# Patient Record
Sex: Female | Born: 1960 | Race: White | Hispanic: No | Smoking: Current every day smoker
Health system: Southern US, Community
[De-identification: ages and names within clinical notes are randomized; demographics above are authoritative.]

## PROBLEM LIST (undated history)

## (undated) DIAGNOSIS — J449 Chronic obstructive pulmonary disease, unspecified: Secondary | ICD-10-CM

## (undated) HISTORY — DX: Chronic obstructive pulmonary disease, unspecified: J44.9

## (undated) HISTORY — PX: TUBAL LIGATION: SHX77

## (undated) HISTORY — PX: COSMETIC SURGERY: SHX468

## (undated) HISTORY — PX: CHOLECYSTECTOMY: SHX55

---

## 1999-04-02 ENCOUNTER — Other Ambulatory Visit: Admission: RE | Admit: 1999-04-02 | Discharge: 1999-04-02 | Payer: Self-pay | Admitting: Family Medicine

## 2005-03-27 ENCOUNTER — Other Ambulatory Visit: Payer: Self-pay

## 2005-03-27 ENCOUNTER — Inpatient Hospital Stay: Payer: Self-pay | Admitting: Infectious Diseases

## 2006-01-01 ENCOUNTER — Ambulatory Visit: Payer: Self-pay | Admitting: Internal Medicine

## 2006-02-04 ENCOUNTER — Ambulatory Visit: Payer: Self-pay | Admitting: Internal Medicine

## 2006-04-08 ENCOUNTER — Ambulatory Visit: Payer: Self-pay | Admitting: Internal Medicine

## 2006-10-05 ENCOUNTER — Ambulatory Visit: Payer: Self-pay | Admitting: Internal Medicine

## 2006-10-27 IMAGING — CR DG CHEST 2V
1 series · 2 of 2 positions shown · non-contrast
Comparison: none

REASON FOR EXAM: Shortness of breath
COMMENTS:

PROCEDURE:     DXR - DXR CHEST PA (OR AP) AND LATERAL  - March 27, 2005  [DATE]
RESULT:     The lungs are mildly hyperinflated.  Increased perihilar lung
markings are present.  There is blunting of the posterior costophrenic
angles.  The heart is top normal in size.

[Series 1: view not recorded · 0.17mm/px · 2 of 2 slices shown]
[im 1/2]
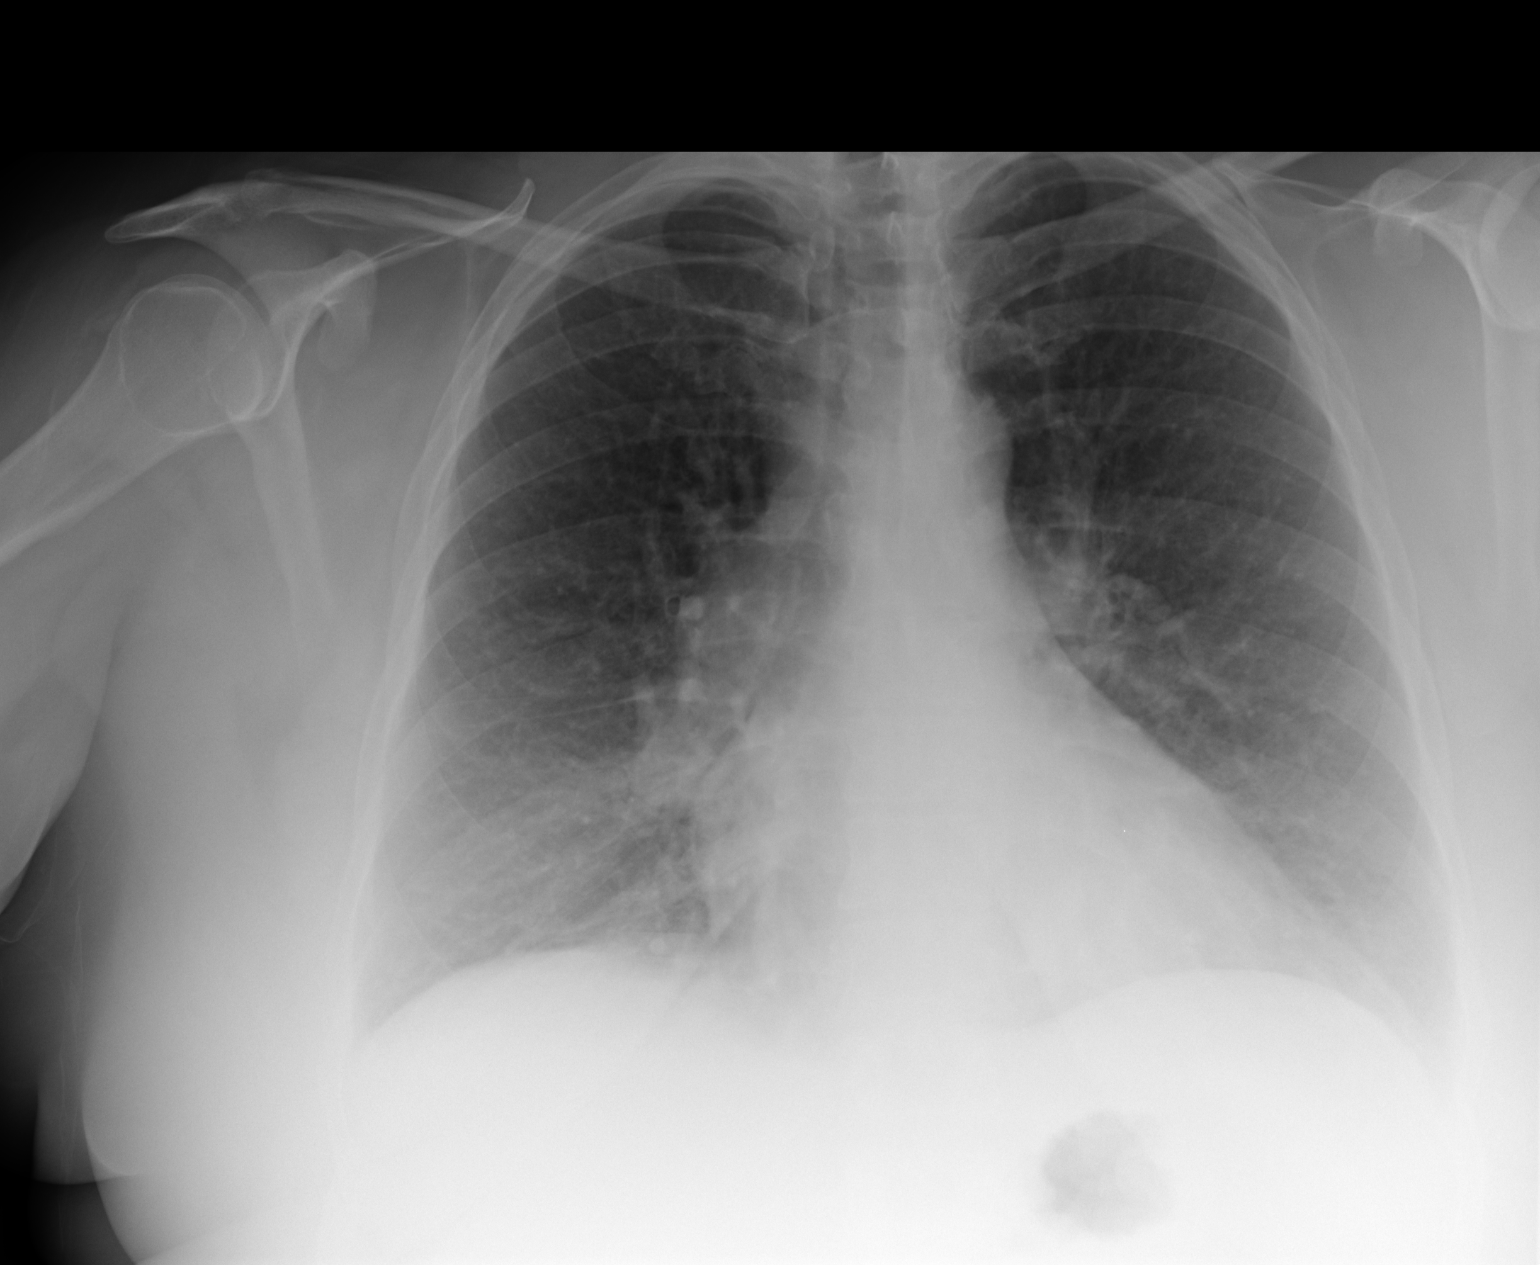
[im 2/2]
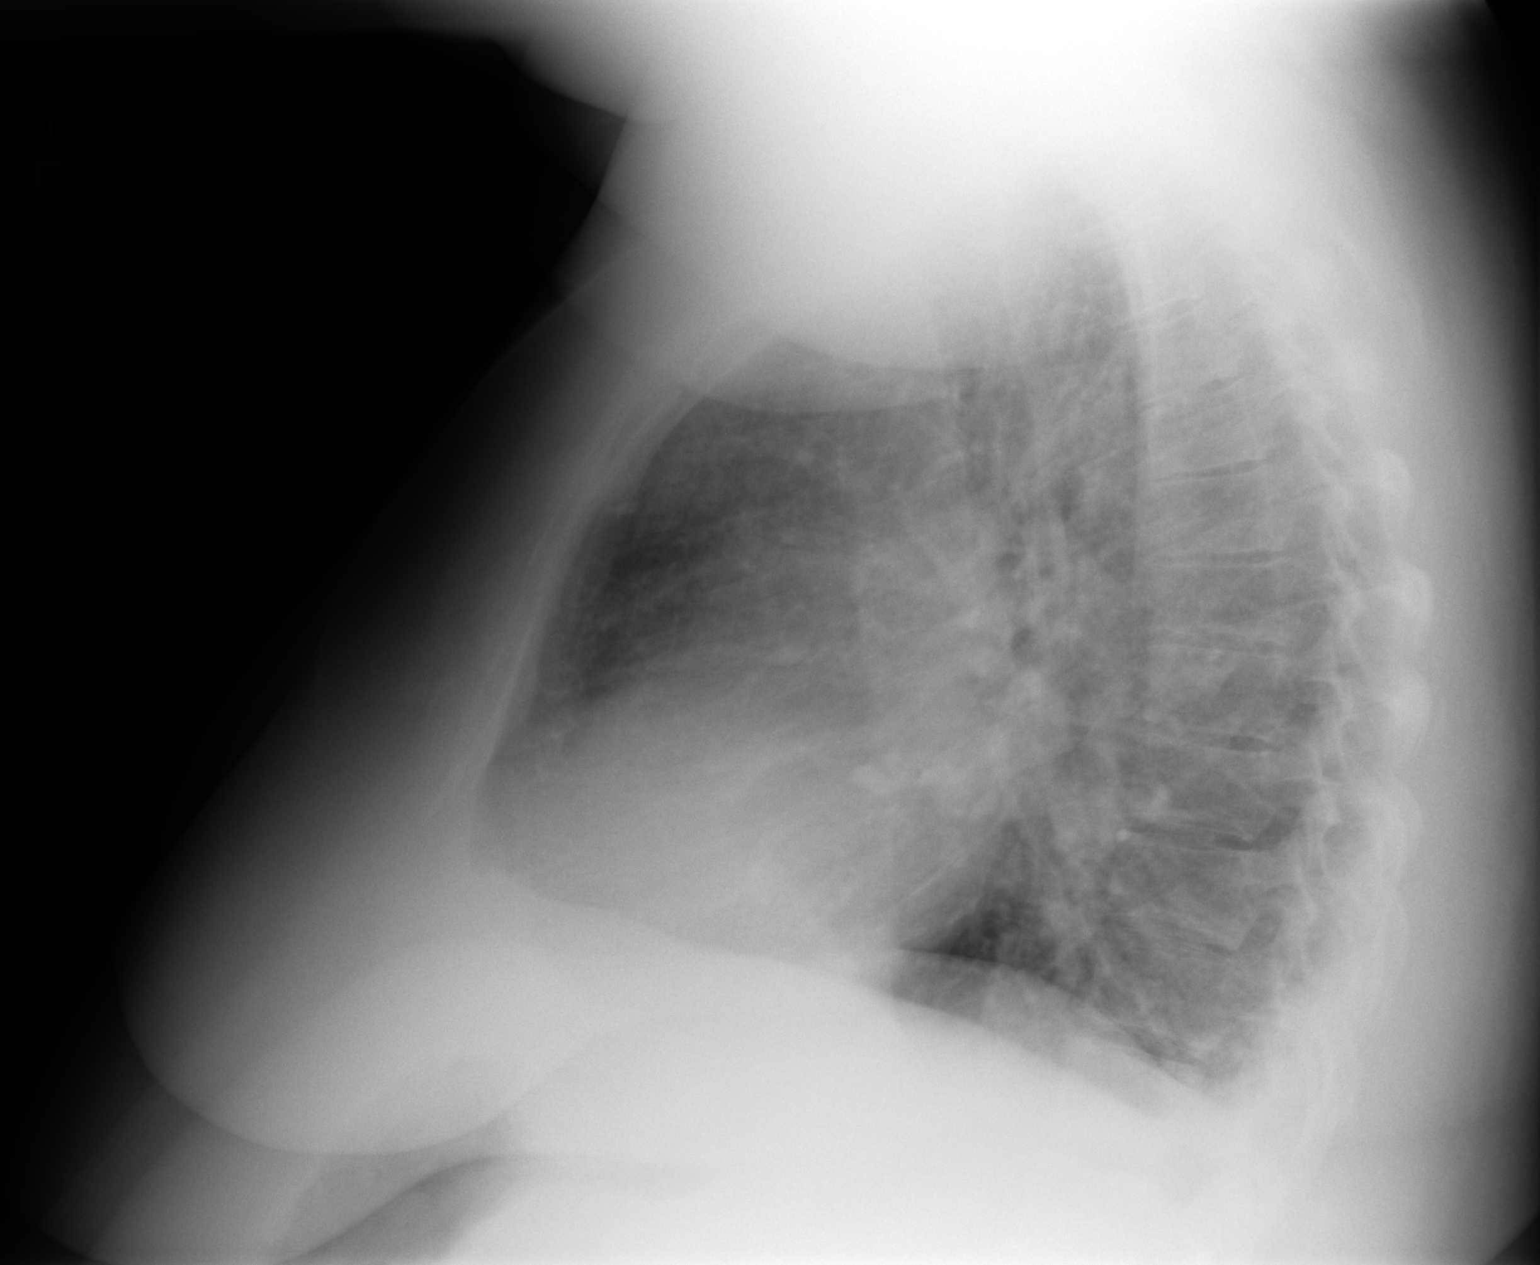

[2 of 2 positions shown; findings below may reference images not displayed]

IMPRESSION: There are findings which likely reflect acute
bronchitis.  I do not see evidence of pneumonia.  Follow films are
recommended to assure resolution of the patient's symptoms following
therapy.

## 2007-11-10 DIAGNOSIS — G4733 Obstructive sleep apnea (adult) (pediatric): Secondary | ICD-10-CM

## 2007-11-10 DIAGNOSIS — J45909 Unspecified asthma, uncomplicated: Secondary | ICD-10-CM | POA: Insufficient documentation

## 2007-11-10 DIAGNOSIS — R071 Chest pain on breathing: Secondary | ICD-10-CM

## 2007-11-10 DIAGNOSIS — I872 Venous insufficiency (chronic) (peripheral): Secondary | ICD-10-CM | POA: Insufficient documentation

## 2007-11-10 DIAGNOSIS — F17200 Nicotine dependence, unspecified, uncomplicated: Secondary | ICD-10-CM

## 2007-11-10 DIAGNOSIS — J4489 Other specified chronic obstructive pulmonary disease: Secondary | ICD-10-CM | POA: Insufficient documentation

## 2007-11-10 DIAGNOSIS — J449 Chronic obstructive pulmonary disease, unspecified: Secondary | ICD-10-CM

## 2007-11-10 DIAGNOSIS — K219 Gastro-esophageal reflux disease without esophagitis: Secondary | ICD-10-CM

## 2007-11-14 ENCOUNTER — Ambulatory Visit: Payer: Self-pay | Admitting: Internal Medicine

## 2010-12-19 NOTE — Assessment & Plan Note (Signed)
[facility]                               PULMONARY OFFICE NOTE   NAME:Kelly Dyer, Kelly Dyer                        MRN:          [id number]  DATE:04/08/2006                            DOB:          [birth year]    PROBLEM LIST:  1. Chronic obstructive pulmonary disease with asthma.  2. Probable esophageal reflux.  3. Probable obstructive sleep apnea.  4. Tobacco use.  5. Musculoskeletal chest wall pain.  6. Peripheral venous insufficiency with varices.   HISTORY OF PRESENT ILLNESS:  She continues to smoke and has not really made  any lifestyle changes since last year.  She says she is trying Chantix.  She  describes an occasional burning sensation across the mid scapular area only  when sitting quietly.  It is not exertional and does not seem to radiate or  associate with food.  Theophylline has been some help and has not caused  problems she recognizes at 200 mg b.i.d.   MEDICATIONS:  1. Wellbutrin.  2. Advair 500/50.  3. Blood pressure medicine she still does not know the name of.  4. Spiriva.  5. Singulair.  6. Home nebulizer with DuoNeb use two to three times a day when needed.  7. Home oxygen at 2 L used at night.  8. Theophylline 200 mg b.i.d.  9. Claritin.   ALLERGIES:  No known drug allergies.   PHYSICAL EXAMINATION:  VITAL SIGNS:  Weight is down a little at 307 pounds,  BP 140/76, pulse regular at 87, room air saturation at 94%.  NECK:  I find no adenopathy and no neck vein distention and no bruits.  LUNGS:  She has a mild cough, but no rhonchi or wheeze.  She remains quite  obese.  HEART:  Heart sounds are regular without murmur or gallop.  EXTREMITIES:  Legs are heavy with some peripheral varices.   Chest x-ray last time had shown severe obstructive airway disease.   IMPRESSION:  1. Asthma with chronic obstructive pulmonary disease.  2. Ongoing tobacco abuse.  3. Obesity.   I discussed mid scapular pain or discomfort as  nonspecific, but possibly  reflecting angina, reflux or arthritic changes in the back.  She is going to  pay more attention to pattern and especially to whether it is changing.  She  will share this with Dr. Purnell Shoemaker, but my impression at this point is that it  does not sound cardiac.   PLAN:  1. Smoking cessation and weight loss were re-emphasized.  2. Walk for endurance as well as tolerated.  3. Continue Theophylline at 200 mg b.i.d. with other medications as      before.  4. Schedule return in 6 months, earlier p.r.n.                                   Clinton D. Maple Hudson, MD, [facility], FACP   CDY/MedQ  DD:  04/08/2006  DT:  04/09/2006  Job #:  [id number]   cc:   Lianne Bushy, M.D.

## 2010-12-19 NOTE — Assessment & Plan Note (Signed)
[facility]                             PULMONARY OFFICE NOTE   NAME:Kerschner, MISHAL PROBERT                        MRN:          [id number]  DATE:10/05/2006                            DOB:          25-Jun-1961    PROBLEMS:  1. Chronic obstructive pulmonary disease with asthma.  2. Probable esophageal reflux.  3. Probable obstructive sleep apnea.  4. Tobacco use.  5. Musculoskeletal chest wall pain.  6. Peripheral venous insufficiency with varices.   HISTORY:  She continues to smoke about a pack a day and has not made  significant effort to stop.  Recent chest cold, still coughing  significant amounts of yellow mucus, no blood, no chest pain, feels  tight, did have fever which has now resolved.   MEDICATIONS:  1. Wellbutrin.  2. Advair 500/50 one b.i.d.  3. Blood pressure pill.  4. Spiriva.  5. Singulair 10 mg.  6. DuoNeb by nebulizer b.i.d. to t.i.d. p.r.n.  7. Oxygen at 2 liters for sleep.  8. Theophylline 200 mg b.i.d.  9. P.r.n. use of Claritin.   NO MEDICATION ALLERGY.   Pulmonary function testing in July 2007 demonstrated severe obstructive  airways disease and this was reviewed again with her emphasizing smoking  cessation, available help, and support.  Motivation is not there.   OBJECTIVE:  Weight 294 pounds, blood pressure 130/78, pulse regular 77,  room air saturation at rest was 88%.  Moderate nasal congestion,  congested cough, no focal findings.  Quite obese.  HEART:  Sounds regular without murmur.  No adenopathy, no edema.   IMPRESSION:  Acute exacerbation of chronic obstructive pulmonary disease  consistent with recent viral syndrome bronchitis.   PLAN:  1. Smoking cessation was heavily emphasized.  2. Theophylline level.  3. Nebulizer treatment Xopenex 1.25 mg.  4. Depo-Medrol 80 mg IM.  5. Doxycycline for 7 days.  6. Schedule return 6 months, earlier p.r.n.     Clinton D. Maple Hudson, MD, [facility], FACP  Electronically  Signed    CDY/MedQ  DD: 10/06/2006  DT: 10/06/2006  Job #: [id number]   cc:   Lianne Bushy, M.D.

## 2014-12-26 ENCOUNTER — Other Ambulatory Visit: Payer: Self-pay | Admitting: *Deleted

## 2014-12-26 DIAGNOSIS — R609 Edema, unspecified: Secondary | ICD-10-CM

## 2015-02-14 ENCOUNTER — Encounter: Payer: Self-pay | Admitting: Surgery

## 2015-02-18 ENCOUNTER — Ambulatory Visit (INDEPENDENT_AMBULATORY_CARE_PROVIDER_SITE_OTHER): Payer: Medicaid Other | Admitting: Surgery

## 2015-02-18 ENCOUNTER — Encounter: Payer: Self-pay | Admitting: Surgery

## 2015-02-18 ENCOUNTER — Inpatient Hospital Stay (HOSPITAL_COMMUNITY): Admission: RE | Admit: 2015-02-18 | Payer: Medicaid Other | Source: Ambulatory Visit

## 2015-02-18 VITALS — BP 140/76 | HR 91 | Ht 64.0 in | Wt 351.0 lb

## 2015-02-18 DIAGNOSIS — R600 Localized edema: Secondary | ICD-10-CM | POA: Diagnosis not present

## 2015-02-18 NOTE — Progress Notes (Signed)
Patient name: Kelly Dyer MRN: [id number] DOB: 24-Jun-1961 Sex: female   Referred by: Dr. Marina Goodell  Reason for referral:  Chief Complaint  Patient presents with  . New Evaluation    eval chronic LE edema     HISTORY OF PRESENT ILLNESS:  this is a 54 year old female who comes in today for evaluation of bilateral leg swelling.  The patient states that she has been having problems for over one year, however recently within the past month or so she has noticed that her  Leg becomes red and swollen and started to ooze fluid.  She tells me that she has been placed in a compression wrap and this helped but she is no longer having her leg wrapped.  She denies any history of DVT.  She does state that she had a traumatic event to her right ankle at age 72.  The patient suffers from COPD secondary to ongoing tobacco abuse.  She is on 4 L continuous oxygen.  Her hypercholesterolemia is managed with a statin.  Her blood pressure is medically managed and has been relatively well controlled.  Past Medical History  Diagnosis Date  . COPD (chronic obstructive pulmonary disease)     Past Surgical History  Procedure Laterality Date  . Tubal ligation    . Cosmetic surgery Right   . Cholecystectomy      History   Social History  . Marital Status: Divorced    Spouse Name: N/A  . Number of Children: N/A  . Years of Education: N/A   Occupational History  . Not on file.   Social History Main Topics  . Smoking status: Current Every Day Smoker -- 1.00 packs/day    Types: Cigarettes  . Smokeless tobacco: Not on file  . Alcohol Use: No  . Drug Use: No  . Sexual Activity: Not on file   Other Topics Concern  . Not on file   Social History Narrative  . No narrative on file    Family History  Problem Relation Age of Onset  . Diabetes Mother   . Hypertension Mother   . Hyperlipidemia Mother   . Heart attack Mother   . Hypertension Father   . Hypertension Sister   . Diabetes Brother       Allergies as of 02/18/2015  . (No Known Allergies)    No current outpatient prescriptions on file prior to visit.   No current facility-administered medications on file prior to visit.     REVIEW OF SYSTEMS: Cardiovascular:  Positive for chest pain , shortness of breath when lying flat and with exertion , pain in legs on walking and lying flat, leg swelling Pulmonary:  Positive for productive cough,  and wheezing. Neurologic: No positive for leg weakness.  Negative for, paresthesias, aphasia, or amaurosis. No dizziness. Hematologic: No bleeding problems or clotting disorders. Musculoskeletal: No joint pain or joint swelling. Gastrointestinal: No blood in stool or hematemesis Genitourinary: No dysuria or hematuria. Psychiatric:: No history of major depression. Integumentary: No rashes or ulcers. Constitutional: No fever or chills.  PHYSICAL EXAMINATION:  Filed Vitals:   02/18/15 1430  BP: 140/76  Pulse: 91  Height:  (1.626 m)  Weight: 351 lb (159.213 kg)  SpO2: 90%   Body mass index is 60.22 kg/(m^2). General: The patient appears their stated age.   HEENT:  No gross abnormalities Pulmonary: Respirations are non-labored Abdomen: Soft and non-tender , obese Musculoskeletal: There are no major deformities.   Neurologic: No focal  weakness or paresthesias are detected, Skin:  Evidence of a healed ulcer on the right medial ankle with surrounding erythema Psychiatric: The patient has normal affect. Cardiovascular:  Pitting edema bilateral lower extremity most pronounced on the foot.  Triphasic Doppler signals.  Diagnostic Studies:  the patient's vascular lab studies were denied in our lab today.  She does come with a right leg ultrasound which was negative for DVT.  There has been no reflux evaluation.    Assessment:   bilateral leg swelling, right greater than left  History of healed right leg ulcer Plan:  the patient suffers either from lymphedema or venous  insufficiency.  Her venous reflux evaluation ultrasound was denied by her insurance today and therefore I do not have this information. At this point , I think the best course of action is getting her into compression stockings to help minimize the edema. I have given her instructions for elastic therapies in [place].  She needs to get fitted for the appropriate size.  I also stressed the importance of leg elevation, understanding that this may  Be difficult given her body habitus.  I'm also making a referral to the lymphedema clinic to see if there are other the size techniques and they helped to minimize her fluid.  We also discussed the importance of smoking cessation and weight loss on her current and long-term health.     Jorge NyV. Wells Brabham IV, M.D. [facility] Office: [phone] Pager:  [phone]

## 2015-02-20 ENCOUNTER — Encounter: Payer: Self-pay | Admitting: Legal Medicine

## 2019-06-19 DIAGNOSIS — J9601 Acute respiratory failure with hypoxia: Secondary | ICD-10-CM

## 2019-06-19 DIAGNOSIS — J189 Pneumonia, unspecified organism: Secondary | ICD-10-CM

## 2019-06-19 DIAGNOSIS — J441 Chronic obstructive pulmonary disease with (acute) exacerbation: Secondary | ICD-10-CM | POA: Diagnosis not present

## 2019-06-19 DIAGNOSIS — G4733 Obstructive sleep apnea (adult) (pediatric): Secondary | ICD-10-CM

## 2019-06-19 DIAGNOSIS — I10 Essential (primary) hypertension: Secondary | ICD-10-CM

## 2019-06-19 DIAGNOSIS — Z720 Tobacco use: Secondary | ICD-10-CM

## 2019-06-20 DIAGNOSIS — J441 Chronic obstructive pulmonary disease with (acute) exacerbation: Secondary | ICD-10-CM | POA: Diagnosis not present

## 2019-06-20 DIAGNOSIS — I10 Essential (primary) hypertension: Secondary | ICD-10-CM | POA: Diagnosis not present

## 2019-06-20 DIAGNOSIS — J9601 Acute respiratory failure with hypoxia: Secondary | ICD-10-CM | POA: Diagnosis not present

## 2019-06-20 DIAGNOSIS — J189 Pneumonia, unspecified organism: Secondary | ICD-10-CM | POA: Diagnosis not present

## 2019-06-21 DIAGNOSIS — I10 Essential (primary) hypertension: Secondary | ICD-10-CM | POA: Diagnosis not present

## 2019-06-21 DIAGNOSIS — J189 Pneumonia, unspecified organism: Secondary | ICD-10-CM | POA: Diagnosis not present

## 2019-06-21 DIAGNOSIS — J441 Chronic obstructive pulmonary disease with (acute) exacerbation: Secondary | ICD-10-CM | POA: Diagnosis not present

## 2019-06-21 DIAGNOSIS — J9601 Acute respiratory failure with hypoxia: Secondary | ICD-10-CM | POA: Diagnosis not present

## 2019-06-22 DIAGNOSIS — J9601 Acute respiratory failure with hypoxia: Secondary | ICD-10-CM | POA: Diagnosis not present

## 2019-06-22 DIAGNOSIS — J441 Chronic obstructive pulmonary disease with (acute) exacerbation: Secondary | ICD-10-CM | POA: Diagnosis not present

## 2019-06-22 DIAGNOSIS — J189 Pneumonia, unspecified organism: Secondary | ICD-10-CM | POA: Diagnosis not present

## 2019-06-22 DIAGNOSIS — I10 Essential (primary) hypertension: Secondary | ICD-10-CM | POA: Diagnosis not present

## 2019-06-23 DIAGNOSIS — J441 Chronic obstructive pulmonary disease with (acute) exacerbation: Secondary | ICD-10-CM | POA: Diagnosis not present

## 2019-06-23 DIAGNOSIS — J189 Pneumonia, unspecified organism: Secondary | ICD-10-CM | POA: Diagnosis not present

## 2019-06-23 DIAGNOSIS — J9601 Acute respiratory failure with hypoxia: Secondary | ICD-10-CM | POA: Diagnosis not present

## 2019-06-23 DIAGNOSIS — I10 Essential (primary) hypertension: Secondary | ICD-10-CM | POA: Diagnosis not present

## 2019-06-24 DIAGNOSIS — J189 Pneumonia, unspecified organism: Secondary | ICD-10-CM | POA: Diagnosis not present

## 2019-06-24 DIAGNOSIS — I10 Essential (primary) hypertension: Secondary | ICD-10-CM | POA: Diagnosis not present

## 2019-06-24 DIAGNOSIS — J9601 Acute respiratory failure with hypoxia: Secondary | ICD-10-CM | POA: Diagnosis not present

## 2019-06-24 DIAGNOSIS — J441 Chronic obstructive pulmonary disease with (acute) exacerbation: Secondary | ICD-10-CM | POA: Diagnosis not present

## 2019-06-25 DIAGNOSIS — J441 Chronic obstructive pulmonary disease with (acute) exacerbation: Secondary | ICD-10-CM | POA: Diagnosis not present

## 2019-06-25 DIAGNOSIS — I10 Essential (primary) hypertension: Secondary | ICD-10-CM | POA: Diagnosis not present

## 2019-06-25 DIAGNOSIS — J189 Pneumonia, unspecified organism: Secondary | ICD-10-CM | POA: Diagnosis not present

## 2019-06-25 DIAGNOSIS — J9601 Acute respiratory failure with hypoxia: Secondary | ICD-10-CM | POA: Diagnosis not present

## 2020-06-13 DIAGNOSIS — I361 Nonrheumatic tricuspid (valve) insufficiency: Secondary | ICD-10-CM | POA: Diagnosis not present

## 2020-06-13 DIAGNOSIS — R0602 Shortness of breath: Secondary | ICD-10-CM | POA: Diagnosis not present

## 2020-07-03 DEATH — deceased
